# Patient Record
Sex: Male | Born: 1995 | Race: Black or African American | Hispanic: No | Marital: Single | Smoking: Current every day smoker
Health system: Southern US, Community
[De-identification: ages and names within clinical notes are randomized; demographics above are authoritative.]

## PROBLEM LIST (undated history)

## (undated) HISTORY — PX: OTHER SURGICAL HISTORY: SHX169

---

## 2017-01-31 ENCOUNTER — Emergency Department (HOSPITAL_COMMUNITY)
Admission: EM | Admit: 2017-01-31 | Discharge: 2017-02-01 | Disposition: A | Discharge status: Home / Self Care | Payer: 59 | Attending: Emergency Medicine | Admitting: Emergency Medicine

## 2017-01-31 ENCOUNTER — Encounter (HOSPITAL_COMMUNITY): Payer: Self-pay

## 2017-01-31 DIAGNOSIS — F17200 Nicotine dependence, unspecified, uncomplicated: Secondary | ICD-10-CM | POA: Insufficient documentation

## 2017-01-31 DIAGNOSIS — J069 Acute upper respiratory infection, unspecified: Secondary | ICD-10-CM | POA: Diagnosis not present

## 2017-01-31 DIAGNOSIS — J029 Acute pharyngitis, unspecified: Secondary | ICD-10-CM | POA: Diagnosis present

## 2017-01-31 DIAGNOSIS — B9789 Other viral agents as the cause of diseases classified elsewhere: Secondary | ICD-10-CM | POA: Insufficient documentation

## 2017-01-31 LAB — RAPID STREP SCREEN (MED CTR MEBANE ONLY): STREPTOCOCCUS, GROUP A SCREEN (DIRECT): NEGATIVE

## 2017-01-31 NOTE — ED Triage Notes (Signed)
Pt states that since Sat he has had sinus drainage, sore throat and wheezing

## 2017-02-01 MED ORDER — PROMETHAZINE-DM 6.25-15 MG/5ML PO SYRP: 5.0000 mL | ORAL_SOLUTION | Freq: Four times a day (QID) | ORAL | 0 refills | Status: DC | PRN

## 2017-02-01 MED ORDER — LIDOCAINE VISCOUS 2 % MT SOLN: 15.0000 mL | OROMUCOSAL | 0 refills | Status: DC | PRN

## 2017-02-01 MED ORDER — SALINE SPRAY 0.65 % NA SOLN
1.0000 | NASAL | 0 refills | Status: DC | PRN
Start: 2017-02-01 — End: 2020-01-23

## 2017-02-01 MED ORDER — LIDOCAINE VISCOUS 2 % MT SOLN
15.0000 mL | Freq: Once | OROMUCOSAL | Status: AC
  Administered 2017-02-01: 15 mL via OROMUCOSAL
  Filled 2017-02-01: qty 15

## 2017-02-01 NOTE — Discharge Instructions (Signed)
Your symptoms are consistent with a viral upper respiratory infection.  Treatment for this includes treating your symptoms.  You can swallow 15 mL of viscous lidocaine every 3 hours as needed for sore throat.  Please do not use this medication more than once every 3 hours or swallowing more than directed.  You can take 5 mL of the promethazine dextromethorphan cough syrup every 6 hours as needed for cough and nasal congestion.  Saline [place] nasal spray can be sprayed once in each nostril as needed for congestion.  There also over-the-counter treatments that may also help your symptoms.  You can continue to gargle salt water and drink hot tea to help your sore throat.  Some studies have shown improvement in cough when you add honey to tea.   Take 650 mg of Tylenol once every 6 hours or 1000 mg of Tylenol once every 8 hours for body aches and pain.  You may also use 600 mg of ibuprofen, as long as it is taken with food, once every 6 hours to help with body aches and pain.   It may take up to a week before your symptoms start to improve.  Your cough may last up to 4-6 weeks.  If you develop any new or worsening symptoms, including shortness of breath, chest pain, vomiting, or fever that does not improve with Tylenol, please return to the emergency department for reevaluation.  Please continue to wash your hands and cover your mouth when you cough to avoid spread of infection.

## 2017-02-01 NOTE — ED Provider Notes (Signed)
[facility] Provider Note   CSN: [id number] Arrival date & time: 01/31/17  2203     History   Chief Complaint Chief Complaint  Patient presents with  . Sore Throat    HPI Travis Black is a 21 y.o. male who presents to the emergency department with constant nasal congestion, intermittent productive cough with yellow sputum, sore throat, postnasal drip, and rhinorrhea that began 5 days ago.  He denies fever, chills, myalgias, shortness of breath, chest pain, neck pain or stiffness, or headache.  No sick contacts at home.  He reports that he is treated his sore throat by gargling salt water and drinking hot tea with lemon with moderate improvement.  He denies muffled voice, drooling, or trismus.  No sick contacts at home.  He reports that he smokes approximately 2-3 Black and mild cigars every day.  No history of asthma or other chronic pulmonary conditions.  He is not up-to-date on his flu shot.  The history is provided by the patient. No language interpreter was used.    History reviewed. No pertinent past medical history.  There are no active problems to display for this patient.   Past Surgical History:  Procedure Laterality Date  . arm surgery         Home Medications    Prior to Admission medications   Medication Sig Start Date End Date Taking? Authorizing Provider  lidocaine (XYLOCAINE) 2 % solution Use as directed 15 mLs in the mouth or throat every 3 (three) hours as needed for mouth pain. 02/01/17   Alder Murri A, PA-C  promethazine-dextromethorphan (PROMETHAZINE-DM) 6.25-15 MG/5ML syrup Take 5 mLs by mouth 4 (four) times daily as needed for cough. 02/01/17   Turner Kunzman A, PA-C  sodium chloride (OCEAN) 0.65 % SOLN nasal spray Place 1 spray into both nostrils as needed for congestion. 02/01/17   Ariahna Smiddy, Pedro EarlsMia A, PA-C    Family History No family history on file.  Social History Social History   Tobacco Use  .  Smoking status: Current Every Day Smoker  . Smokeless tobacco: Never Used  Substance Use Topics  . Alcohol use: No    Frequency: Never  . Drug use: No     Allergies   Patient has no known allergies.   Review of Systems Review of Systems  Constitutional: Negative for appetite change, chills, fatigue and fever.  HENT: Positive for congestion, postnasal drip, rhinorrhea and sore throat. Negative for drooling, ear discharge, ear pain, mouth sores, sinus pressure, sinus pain and trouble swallowing.   Eyes: Negative for visual disturbance.  Respiratory: Positive for cough. Negative for chest tightness, shortness of breath, wheezing and stridor.   Cardiovascular: Negative for chest pain, palpitations and leg swelling.  Gastrointestinal: Negative for abdominal pain, diarrhea, nausea and vomiting.  Genitourinary: Negative for dysuria, frequency, hematuria and urgency.  Musculoskeletal: Negative for arthralgias, back pain, myalgias and neck stiffness.  Skin: Negative for rash.  Neurological: Negative for syncope, light-headedness, numbness and headaches.  Hematological: Negative for adenopathy.  Psychiatric/Behavioral: The patient is not nervous/anxious.   All other systems reviewed and are negative.    Physical Exam Updated Vital Signs BP 140/90   Pulse 86   Temp 98.3 F (36.8 C) (Oral)   Resp 18   Ht 5\' 9"  (1.753 m)   Wt 97.5 kg (215 lb)   SpO2 99%   BMI 31.75 kg/m   Physical Exam  Constitutional: He appears well-developed.  HENT:  Head: Normocephalic and  atraumatic.  Right Ear: No mastoid tenderness. A middle ear effusion is present.  Left Ear: No mastoid tenderness. A middle ear effusion is present.  Nose: Mucosal edema and rhinorrhea present. Right sinus exhibits no maxillary sinus tenderness and no frontal sinus tenderness. Left sinus exhibits no maxillary sinus tenderness and no frontal sinus tenderness.  Mouth/Throat: Uvula is midline and mucous membranes are normal.  Mucous membranes are not pale. No trismus in the jaw. Posterior oropharyngeal erythema present. No oropharyngeal exudate, posterior oropharyngeal edema or tonsillar abscesses. No tonsillar exudate.  Eyes: Conjunctivae are normal. Right eye exhibits no discharge. Left eye exhibits no discharge. No scleral icterus.  Neck: Neck supple.  Cardiovascular: Normal rate, regular rhythm, normal heart sounds and intact distal pulses. Exam reveals no gallop and no friction rub.  No murmur heard. Pulmonary/Chest: Effort normal and breath sounds normal. No stridor. No respiratory distress. He has no wheezes. He has no rales. He exhibits no tenderness.  Lungs are clear to auscultation bilaterally.  No wheezing.  Abdominal: Soft. He exhibits no distension. There is no tenderness.  Neurological: He is alert.  Skin: Skin is warm and dry.  Psychiatric: His behavior is normal.  Nursing note and vitals reviewed.    ED Treatments / Results  Labs (all labs ordered are listed, but only abnormal results are displayed) Labs Reviewed  RAPID STREP SCREEN (NOT AT [facility])  CULTURE, GROUP A STREP [facility]    EKG  EKG Interpretation None       Radiology No results found.  Procedures Procedures (including critical care time)  Medications Ordered in ED Medications  lidocaine (XYLOCAINE) 2 % viscous mouth solution 15 mL (15 mLs Mouth/Throat Given 02/01/17 0043)     Initial Impression / Assessment and Plan / ED Course  I have reviewed the triage vital signs and the nursing notes.  Pertinent labs & imaging results that were available during my care of the patient were reviewed by me and considered in my medical decision making (see chart for details).     21 year old male presenting with URI symptoms times 5 days.  On physical exam, lungs are clear to auscultation bilaterally.  SaO2 99% on room air.  Chest x-rays not indicated at this time.  Rapid strep test is negative.  Patients symptoms are consistent  with URI, likely viral etiology. Discussed that antibiotics are not indicated for viral infections. Pt will be discharged with symptomatic treatment.  Verbalizes understanding and is agreeable with plan. Pt is hemodynamically stable & in NAD prior to dc.  Strict return precautions given.  Patient is safe for discharge at this time.  Final Clinical Impressions(s) / ED Diagnoses   Final diagnoses:  Viral URI with cough    ED Discharge Orders        Ordered    lidocaine (XYLOCAINE) 2 % solution  Every  3 hours PRN     02/01/17 0033    promethazine-dextromethorphan (PROMETHAZINE-DM) 6.25-15 MG/5ML syrup  4 times daily PRN     02/01/17 0033    sodium chloride (OCEAN) 0.65 % SOLN nasal spray  As needed     02/01/17 0033       Jaxtyn Linville, Pedro EarlsMia A, PA-C 02/01/17 0045    Dione BoozeGlick, David, MD 02/01/17 [phone]

## 2017-02-03 LAB — CULTURE, GROUP A STREP (THRC)

## 2017-03-08 DIAGNOSIS — G479 Sleep disorder, unspecified: Secondary | ICD-10-CM | POA: Diagnosis not present

## 2017-03-08 DIAGNOSIS — R197 Diarrhea, unspecified: Secondary | ICD-10-CM | POA: Diagnosis not present

## 2017-03-08 DIAGNOSIS — Z711 Person with feared health complaint in whom no diagnosis is made: Secondary | ICD-10-CM | POA: Diagnosis not present

## 2017-04-05 DIAGNOSIS — Z0000 Encounter for general adult medical examination without abnormal findings: Secondary | ICD-10-CM | POA: Diagnosis not present

## 2017-04-05 DIAGNOSIS — Z1329 Encounter for screening for other suspected endocrine disorder: Secondary | ICD-10-CM | POA: Diagnosis not present

## 2017-04-05 DIAGNOSIS — Z13220 Encounter for screening for lipoid disorders: Secondary | ICD-10-CM | POA: Diagnosis not present

## 2017-04-05 DIAGNOSIS — Z130 Encounter for screening for diseases of the blood and blood-forming organs and certain disorders involving the immune mechanism: Secondary | ICD-10-CM | POA: Diagnosis not present

## 2017-10-28 DIAGNOSIS — Z23 Encounter for immunization: Secondary | ICD-10-CM | POA: Diagnosis not present

## 2018-05-28 DIAGNOSIS — J45909 Unspecified asthma, uncomplicated: Secondary | ICD-10-CM | POA: Diagnosis not present

## 2018-05-29 DIAGNOSIS — J45901 Unspecified asthma with (acute) exacerbation: Secondary | ICD-10-CM | POA: Diagnosis not present

## 2018-05-29 DIAGNOSIS — Z720 Tobacco use: Secondary | ICD-10-CM | POA: Diagnosis not present

## 2018-06-03 DIAGNOSIS — J45901 Unspecified asthma with (acute) exacerbation: Secondary | ICD-10-CM | POA: Diagnosis not present

## 2019-11-02 ENCOUNTER — Emergency Department (HOSPITAL_BASED_OUTPATIENT_CLINIC_OR_DEPARTMENT_OTHER)
Admission: EM | Admit: 2019-11-02 | Discharge: 2019-11-02 | Disposition: A | Discharge status: Home / Self Care | Payer: 59 | Attending: Emergency Medicine | Admitting: Emergency Medicine

## 2019-11-02 ENCOUNTER — Other Ambulatory Visit: Payer: Self-pay

## 2019-11-02 ENCOUNTER — Emergency Department (HOSPITAL_BASED_OUTPATIENT_CLINIC_OR_DEPARTMENT_OTHER): Payer: 59

## 2019-11-02 ENCOUNTER — Encounter (HOSPITAL_BASED_OUTPATIENT_CLINIC_OR_DEPARTMENT_OTHER): Payer: Self-pay | Admitting: Emergency Medicine

## 2019-11-02 DIAGNOSIS — K625 Hemorrhage of anus and rectum: Secondary | ICD-10-CM | POA: Diagnosis not present

## 2019-11-02 DIAGNOSIS — R109 Unspecified abdominal pain: Secondary | ICD-10-CM | POA: Diagnosis present

## 2019-11-02 DIAGNOSIS — K648 Other hemorrhoids: Secondary | ICD-10-CM | POA: Insufficient documentation

## 2019-11-02 DIAGNOSIS — K644 Residual hemorrhoidal skin tags: Secondary | ICD-10-CM

## 2019-11-02 DIAGNOSIS — F17210 Nicotine dependence, cigarettes, uncomplicated: Secondary | ICD-10-CM | POA: Insufficient documentation

## 2019-11-02 LAB — COMPREHENSIVE METABOLIC PANEL
ALT: 19 U/L (ref 0–44)
AST: 25 U/L (ref 15–41)
Albumin: 4.5 g/dL (ref 3.5–5.0)
Alkaline Phosphatase: 40 U/L (ref 38–126)
Anion gap: 10 (ref 5–15)
BUN: 15 mg/dL (ref 6–20)
CO2: 24 mmol/L (ref 22–32)
Calcium: 9.4 mg/dL (ref 8.9–10.3)
Chloride: 102 mmol/L (ref 98–111)
Creatinine, Ser: 0.85 mg/dL (ref 0.61–1.24)
GFR calc Af Amer: >60 mL/min (ref >60)
GFR calc non Af Amer: >60 mL/min (ref >60)
Glucose, Bld: 95 mg/dL (ref 70–99)
Potassium: 4.2 mmol/L (ref 3.5–5.1)
Sodium: 136 mmol/L (ref 135–145)
Total Bilirubin: 0.4 mg/dL (ref 0.3–1.2)
Total Protein: 8.1 g/dL (ref 6.5–8.1)

## 2019-11-02 LAB — CBC
HCT: 46.5 % (ref 39.0–52.0)
Hemoglobin: 15.5 g/dL (ref 13.0–17.0)
MCH: 30.8 pg (ref 26.0–34.0)
MCHC: 33.3 g/dL (ref 30.0–36.0)
MCV: 92.4 fL (ref 80.0–100.0)
Platelets: 285 10*3/uL (ref 150–400)
RBC: 5.03 MIL/uL (ref 4.22–5.81)
RDW: 12.5 % (ref 11.5–15.5)
WBC: 8.6 10*3/uL (ref 4.0–10.5)
nRBC: 0 % (ref 0.0–0.2)

## 2019-11-02 MED ORDER — HYDROCORTISONE ACETATE 25 MG RE SUPP: 25.0000 mg | Freq: Two times a day (BID) | RECTAL | 0 refills | Status: DC

## 2019-11-02 MED ORDER — IOHEXOL 300 MG/ML  SOLN
100.0000 mL | Freq: Once | INTRAMUSCULAR | Status: AC | PRN
  Administered 2019-11-02: 100 mL via INTRAVENOUS

## 2019-11-02 NOTE — Discharge Instructions (Signed)
Your blood count is normal right now and your CT scan showed no inflamed bowel.  Your bleeding is likely from hemorrhoids.  You can try the suppositories to help you with your hemorrhage.  Since you have persistent bleeding, please follow-up with GI doctor for further evaluation.  Return to ER if you have uncontrolled bleeding and severe abdominal pain and vomiting or fevers.

## 2019-11-02 NOTE — ED Triage Notes (Signed)
Reports lower abdominal pain for the last week with blood in stool.  Reports poor appetite.  Denies nausea.  Reports hx of hemorhoids that he treats with otc meds.  Reports blood is sometimes bright red and sometimes dark in color.

## 2019-11-02 NOTE — ED Provider Notes (Signed)
[facility] EMERGENCY DEPARTMENT Provider Note   CSN: [id number] Arrival date & time: 11/02/19  1521     History Chief Complaint  Patient presents with  . Abdominal Pain    Travis Black is a 24 y.o. male here presenting with blood in the stool and abdominal pain.  Patient states that for the last several months he has intermittent abdominal pain and also blood in his stool.  He states that sometimes it is dark and other times it is bright red.  He thought he has hemorrhoids and tried some Anusol cream with minimal relief.  He states that he has poor appetite and the cramps got worse so came in for further evaluation.  Denies any fevers.  The history is provided by the patient.       History reviewed. No pertinent past medical history.  There are no problems to display for this patient.   Past Surgical History:  Procedure Laterality Date  . arm surgery         No family history on file.  Social History   Tobacco Use  . Smoking status: Current Every Day Smoker  . Smokeless tobacco: Never Used  Substance Use Topics  . Alcohol use: No  . Drug use: No    Home Medications Prior to Admission medications   Medication Sig Start Date End Date Taking? Authorizing Provider  lidocaine (XYLOCAINE) 2 % solution Use as directed 15 mLs in the mouth or throat every 3 (three) hours as needed for mouth pain. 02/01/17   McDonald, Mia A, PA-C  promethazine-dextromethorphan (PROMETHAZINE-DM) 6.25-15 MG/5ML syrup Take 5 mLs by mouth 4 (four) times daily as needed for cough. 02/01/17   McDonald, Mia A, PA-C  sodium chloride (OCEAN) 0.65 % SOLN nasal spray Place 1 spray into both nostrils as needed for congestion. 02/01/17   McDonald, Mia A, PA-C    Allergies    Patient has no known allergies.  Review of Systems   Review of Systems  Gastrointestinal: Positive for abdominal pain.  All other systems reviewed and are negative.   Physical Exam Updated Vital Signs BP  131/79 (BP Location: Right Arm)   Pulse 60   Temp 98 F (36.7 C) (Oral)   Resp 16   Ht 5\' 10"  (1.778 m)   Wt 111.1 kg   SpO2 100%   BMI 35.15 kg/m   Physical Exam Vitals and nursing note reviewed.  HENT:     Head: Normocephalic.     Mouth/Throat:     Mouth: Mucous membranes are moist.  Eyes:     Extraocular Movements: Extraocular movements intact.  Cardiovascular:     Rate and Rhythm: Normal rate and regular rhythm.  Abdominal:     General: Abdomen is flat.     Comments: Mild diffuse lower abdominal tenderness with no rebound  Genitourinary:    Comments: Rectal-small external hemorrhoid with no obvious bleeding Skin:    General: Skin is warm.     Capillary Refill: Capillary refill takes less than 2 seconds.  Neurological:     General: No focal deficit present.     Mental Status: He is alert and oriented to person, place, and time.  Psychiatric:        Mood and Affect: Mood normal.        Behavior: Behavior normal.     ED Results / Procedures / Treatments   Labs (all labs ordered are listed, but only abnormal results are displayed) Labs Reviewed  COMPREHENSIVE METABOLIC  PANEL  CBC  OCCULT BLOOD X 1 CARD TO LAB, STOOL    EKG None  Radiology CT ABDOMEN PELVIS W CONTRAST  Result Date: 11/02/2019 CLINICAL DATA:  Rectal bleeding low abdominal pain EXAM: CT ABDOMEN AND PELVIS WITH CONTRAST TECHNIQUE: Multidetector CT imaging of the abdomen and pelvis was performed using the standard protocol following bolus administration of intravenous contrast. CONTRAST:  OMNIPAQUE IOHEXOL 300 MG/ML  SOLN COMPARISON:  None. FINDINGS: Lower chest: No acute abnormality. Hepatobiliary: No focal liver abnormality is seen. No gallstones, gallbladder wall thickening, or biliary dilatation. Pancreas: Unremarkable. No pancreatic ductal dilatation or surrounding inflammatory changes. Spleen: Normal in size without focal abnormality. Adrenals/Urinary Tract: Adrenal glands are unremarkable.  Kidneys are normal, without renal calculi, focal lesion, or hydronephrosis. Bladder is unremarkable. Stomach/Bowel: Stomach is within normal limits. Appendix appears normal. No evidence of bowel wall thickening, distention, or inflammatory changes. Vascular/Lymphatic: No significant vascular findings are present. No enlarged abdominal or pelvic lymph nodes. Reproductive: Prostate is unremarkable. Other: No abdominal wall hernia or abnormality. No abdominopelvic ascites. Musculoskeletal: No acute or significant osseous findings. IMPRESSION: Negative. No CT evidence for acute intra-abdominal or pelvic abnormality. Electronically Signed   By: Jasmine Pang M.D.   On: 11/02/2019 20:19    Procedures Procedures (including critical care time)  Medications Ordered in ED Medications  iohexol (OMNIPAQUE) 300 MG/ML solution 100 mL (100 mLs Intravenous Contrast Given 11/02/19 2001)    ED Course  I have reviewed the triage vital signs and the nursing notes.  Pertinent labs & imaging results that were available during my care of the patient were reviewed by me and considered in my medical decision making (see chart for details).    MDM Rules/Calculators/A&P                         Travis Black is a 24 y.o. male here presenting with intermittent blood in the stool for the last several months and abdominal pain.  Consider IBD versus IBS versus hemorrhoid bleeding.  Plan to check CBC and CMP and CT abdomen pelvis.  8:37 PM CBC is stable and CT did not show any obvious colitis.  Patient likely has hemorrhoidal bleeding.  We will try Anusol suppositories and have him follow-up with GI.   Final Clinical Impression(s) / ED Diagnoses Final diagnoses:  None    Rx / DC Orders ED Discharge Orders    None       Charlynne Pander, MD 11/02/19 2038

## 2020-01-23 ENCOUNTER — Encounter (HOSPITAL_BASED_OUTPATIENT_CLINIC_OR_DEPARTMENT_OTHER): Payer: Self-pay | Admitting: Emergency Medicine

## 2020-01-23 ENCOUNTER — Ambulatory Visit (HOSPITAL_COMMUNITY): Payer: 59 | Admitting: Anesthesiology

## 2020-01-23 ENCOUNTER — Ambulatory Visit (HOSPITAL_COMMUNITY)
Admission: RE | Admit: 2020-01-23 | Discharge: 2020-01-23 | Disposition: A | Discharge status: Home / Self Care | Payer: 59 | Source: Other Acute Inpatient Hospital | Attending: Orthopedic Surgery | Admitting: Orthopedic Surgery

## 2020-01-23 ENCOUNTER — Other Ambulatory Visit: Payer: Self-pay | Admitting: Orthopedic Surgery

## 2020-01-23 ENCOUNTER — Encounter (HOSPITAL_COMMUNITY): Payer: Self-pay | Admitting: Orthopedic Surgery

## 2020-01-23 ENCOUNTER — Emergency Department (HOSPITAL_BASED_OUTPATIENT_CLINIC_OR_DEPARTMENT_OTHER): Payer: 59

## 2020-01-23 ENCOUNTER — Ambulatory Visit (HOSPITAL_COMMUNITY): Payer: 59

## 2020-01-23 ENCOUNTER — Emergency Department (HOSPITAL_BASED_OUTPATIENT_CLINIC_OR_DEPARTMENT_OTHER)
Admission: EM | Admit: 2020-01-23 | Discharge: 2020-01-23 | Disposition: A | Discharge status: Home / Self Care | Payer: 59 | Source: Home / Self Care | Attending: Emergency Medicine | Admitting: Emergency Medicine

## 2020-01-23 ENCOUNTER — Encounter (HOSPITAL_COMMUNITY): Admission: RE | Disposition: A | Discharge status: Home / Self Care | Payer: Self-pay | Attending: Orthopedic Surgery

## 2020-01-23 ENCOUNTER — Other Ambulatory Visit: Payer: Self-pay

## 2020-01-23 DIAGNOSIS — S52571A Other intraarticular fracture of lower end of right radius, initial encounter for closed fracture: Secondary | ICD-10-CM

## 2020-01-23 DIAGNOSIS — S63391A Traumatic rupture of other ligament of right wrist, initial encounter: Secondary | ICD-10-CM | POA: Insufficient documentation

## 2020-01-23 DIAGNOSIS — Z20822 Contact with and (suspected) exposure to covid-19: Secondary | ICD-10-CM | POA: Insufficient documentation

## 2020-01-23 DIAGNOSIS — S63094A Other dislocation of right wrist and hand, initial encounter: Secondary | ICD-10-CM | POA: Insufficient documentation

## 2020-01-23 DIAGNOSIS — S62171G Displaced fracture of trapezium [larger multangular], right wrist, subsequent encounter for fracture with delayed healing: Secondary | ICD-10-CM | POA: Insufficient documentation

## 2020-01-23 DIAGNOSIS — Z79899 Other long term (current) drug therapy: Secondary | ICD-10-CM | POA: Insufficient documentation

## 2020-01-23 DIAGNOSIS — G5601 Carpal tunnel syndrome, right upper limb: Secondary | ICD-10-CM | POA: Diagnosis not present

## 2020-01-23 DIAGNOSIS — F17290 Nicotine dependence, other tobacco product, uncomplicated: Secondary | ICD-10-CM | POA: Insufficient documentation

## 2020-01-23 DIAGNOSIS — S62001A Unspecified fracture of navicular [scaphoid] bone of right wrist, initial encounter for closed fracture: Secondary | ICD-10-CM

## 2020-01-23 DIAGNOSIS — S52614A Nondisplaced fracture of right ulna styloid process, initial encounter for closed fracture: Secondary | ICD-10-CM

## 2020-01-23 DIAGNOSIS — S52301A Unspecified fracture of shaft of right radius, initial encounter for closed fracture: Secondary | ICD-10-CM | POA: Diagnosis present

## 2020-01-23 DIAGNOSIS — Y92410 Unspecified street and highway as the place of occurrence of the external cause: Secondary | ICD-10-CM | POA: Insufficient documentation

## 2020-01-23 DIAGNOSIS — S52511A Displaced fracture of right radial styloid process, initial encounter for closed fracture: Secondary | ICD-10-CM | POA: Insufficient documentation

## 2020-01-23 HISTORY — PX: OPEN REDUCTION INTERNAL FIXATION (ORIF) SCAPHOID WITH DISTAL RADIUS GRAFT: SHX5667

## 2020-01-23 HISTORY — PX: PERCUTANEOUS PINNING: SHX2209

## 2020-01-23 LAB — RESP PANEL BY RT-PCR (FLU A&B, COVID) ARPGX2
Influenza A by PCR: NEGATIVE
Influenza B by PCR: NEGATIVE
SARS Coronavirus 2 by RT PCR: NEGATIVE

## 2020-01-23 SURGERY — OPEN REDUCTION INTERNAL FIXATION (ORIF) SCAPHOID WITH DISTAL RADIUS GRAFT
Anesthesia: General | Laterality: Right

## 2020-01-23 MED ORDER — FENTANYL CITRATE (PF) 250 MCG/5ML IJ SOLN
INTRAMUSCULAR | Status: AC
  Filled 2020-01-23: qty 5

## 2020-01-23 MED ORDER — PROPOFOL 10 MG/ML IV BOLUS
INTRAVENOUS | Status: DC | PRN
  Administered 2020-01-23: 200 mg via INTRAVENOUS

## 2020-01-23 MED ORDER — OXYCODONE-ACETAMINOPHEN 5-325 MG PO TABS: 1.0000 | ORAL_TABLET | ORAL | 0 refills | Status: DC | PRN

## 2020-01-23 MED ORDER — KETOROLAC TROMETHAMINE 30 MG/ML IJ SOLN
30.0000 mg | Freq: Once | INTRAMUSCULAR | Status: AC
  Administered 2020-01-23: 30 mg via INTRAVENOUS
  Filled 2020-01-23: qty 1

## 2020-01-23 MED ORDER — CHLORHEXIDINE GLUCONATE 0.12 % MT SOLN
OROMUCOSAL | Status: AC
  Administered 2020-01-23: 15 mL via OROMUCOSAL
  Filled 2020-01-23: qty 15

## 2020-01-23 MED ORDER — CHLORHEXIDINE GLUCONATE 0.12 % MT SOLN: 15.0000 mL | Freq: Once | OROMUCOSAL | Status: AC

## 2020-01-23 MED ORDER — ORAL CARE MOUTH RINSE: 15.0000 mL | Freq: Once | OROMUCOSAL | Status: AC

## 2020-01-23 MED ORDER — DOXYCYCLINE HYCLATE 50 MG PO CAPS: 100.0000 mg | ORAL_CAPSULE | Freq: Two times a day (BID) | ORAL | 0 refills | Status: AC

## 2020-01-23 MED ORDER — CEFAZOLIN SODIUM-DEXTROSE 2-3 GM-%(50ML) IV SOLR
INTRAVENOUS | Status: DC | PRN
  Administered 2020-01-23: 2 g via INTRAVENOUS

## 2020-01-23 MED ORDER — FENTANYL CITRATE (PF) 100 MCG/2ML IJ SOLN: 100.0000 ug | Freq: Once | INTRAMUSCULAR | Status: AC

## 2020-01-23 MED ORDER — HYDROCODONE-ACETAMINOPHEN 5-325 MG PO TABS: ORAL_TABLET | ORAL | 0 refills | Status: AC

## 2020-01-23 MED ORDER — SODIUM CHLORIDE 0.9 % IR SOLN
Status: DC | PRN
  Administered 2020-01-23: 1000 mL

## 2020-01-23 MED ORDER — HYDROCODONE-ACETAMINOPHEN 5-325 MG PO TABS
2.0000 | ORAL_TABLET | Freq: Once | ORAL | Status: AC
  Administered 2020-01-23: 2 via ORAL
  Filled 2020-01-23: qty 2

## 2020-01-23 MED ORDER — MIDAZOLAM HCL 2 MG/2ML IJ SOLN
INTRAMUSCULAR | Status: AC
  Administered 2020-01-23: 2 mg via INTRAVENOUS
  Filled 2020-01-23: qty 2

## 2020-01-23 MED ORDER — MIDAZOLAM HCL 2 MG/2ML IJ SOLN: 0.5000 mg | Freq: Once | INTRAMUSCULAR | Status: DC | PRN

## 2020-01-23 MED ORDER — MIDAZOLAM HCL 2 MG/2ML IJ SOLN
INTRAMUSCULAR | Status: AC
  Filled 2020-01-23: qty 2

## 2020-01-23 MED ORDER — OXYCODONE HCL 5 MG PO TABS: 5.0000 mg | ORAL_TABLET | Freq: Once | ORAL | Status: DC | PRN

## 2020-01-23 MED ORDER — MIDAZOLAM HCL 2 MG/2ML IJ SOLN: 2.0000 mg | Freq: Once | INTRAMUSCULAR | Status: AC

## 2020-01-23 MED ORDER — CEFAZOLIN SODIUM-DEXTROSE 2-4 GM/100ML-% IV SOLN
INTRAVENOUS | Status: AC
  Filled 2020-01-23: qty 100

## 2020-01-23 MED ORDER — HYDROMORPHONE HCL 1 MG/ML IJ SOLN: 0.2500 mg | INTRAMUSCULAR | Status: DC | PRN

## 2020-01-23 MED ORDER — FENTANYL CITRATE (PF) 100 MCG/2ML IJ SOLN
INTRAMUSCULAR | Status: AC
  Administered 2020-01-23: 100 ug via INTRAVENOUS
  Filled 2020-01-23: qty 2

## 2020-01-23 MED ORDER — BUPIVACAINE-EPINEPHRINE (PF) 0.5% -1:200000 IJ SOLN
INTRAMUSCULAR | Status: DC | PRN
  Administered 2020-01-23: 30 mL via PERINEURAL

## 2020-01-23 MED ORDER — MEPERIDINE HCL 25 MG/ML IJ SOLN: 6.2500 mg | INTRAMUSCULAR | Status: DC | PRN

## 2020-01-23 MED ORDER — LACTATED RINGERS IV SOLN: INTRAVENOUS | Status: DC

## 2020-01-23 MED ORDER — OXYCODONE HCL 5 MG/5ML PO SOLN: 5.0000 mg | Freq: Once | ORAL | Status: DC | PRN

## 2020-01-23 MED ORDER — PROMETHAZINE HCL 25 MG/ML IJ SOLN: 6.2500 mg | INTRAMUSCULAR | Status: DC | PRN

## 2020-01-23 MED ORDER — FENTANYL CITRATE (PF) 100 MCG/2ML IJ SOLN
INTRAMUSCULAR | Status: DC | PRN
  Administered 2020-01-23 (×2): 50 ug via INTRAVENOUS

## 2020-01-23 SURGICAL SUPPLY — 49 items
0.045 X 4" K-WIRE used as implant with Juggerknot ×9 IMPLANT
ANCHOR SUT 1.45 SZ 1 SHORT (Anchor) ×3 IMPLANT
BNDG ELASTIC 3X5.8 VLCR STR LF (GAUZE/BANDAGES/DRESSINGS) ×3 IMPLANT
BNDG ELASTIC 4X5.8 VLCR STR LF (GAUZE/BANDAGES/DRESSINGS) ×3 IMPLANT
BNDG ESMARK 4X9 LF (GAUZE/BANDAGES/DRESSINGS) ×3 IMPLANT
BNDG GAUZE ELAST 4 BULKY (GAUZE/BANDAGES/DRESSINGS) ×3 IMPLANT
CHLORAPREP W/TINT 26 (MISCELLANEOUS) ×3 IMPLANT
CORD BIPOLAR FORCEPS 12FT (ELECTRODE) ×3 IMPLANT
COVER SURGICAL LIGHT HANDLE (MISCELLANEOUS) ×3 IMPLANT
CUFF TOURN SGL QUICK 18X4 (TOURNIQUET CUFF) ×3 IMPLANT
DRAPE OEC MINIVIEW 54X84 (DRAPES) ×3 IMPLANT
DRAPE SURG 17X23 STRL (DRAPES) ×3 IMPLANT
DRSG PAD ABDOMINAL 8X10 ST (GAUZE/BANDAGES/DRESSINGS) ×6 IMPLANT
GAUZE SPONGE 4X4 12PLY STRL (GAUZE/BANDAGES/DRESSINGS) ×6 IMPLANT
GAUZE XEROFORM 5X9 LF (GAUZE/BANDAGES/DRESSINGS) ×3 IMPLANT
GLOVE BIO SURGEON STRL SZ7.5 (GLOVE) ×3 IMPLANT
GLOVE BIOGEL PI IND STRL 8 (GLOVE) ×1 IMPLANT
GLOVE BIOGEL PI INDICATOR 8 (GLOVE) ×2
GOWN STRL REUS W/ TWL LRG LVL3 (GOWN DISPOSABLE) ×3 IMPLANT
GOWN STRL REUS W/TWL LRG LVL3 (GOWN DISPOSABLE) ×6
K-WIRE .045X4 (WIRE) ×9 IMPLANT
KIT BASIN OR (CUSTOM PROCEDURE TRAY) ×3 IMPLANT
KIT TURNOVER KIT B (KITS) ×3 IMPLANT
LOOP VESSEL MINI RED (MISCELLANEOUS) ×3 IMPLANT
MANIFOLD NEPTUNE II (INSTRUMENTS) ×3 IMPLANT
NS IRRIG 1000ML POUR BTL (IV SOLUTION) ×3 IMPLANT
PACK ORTHO EXTREMITY (CUSTOM PROCEDURE TRAY) ×3 IMPLANT
PAD ARMBOARD 7.5X6 YLW CONV (MISCELLANEOUS) ×3 IMPLANT
PAD CAST 3X4 CTTN HI CHSV (CAST SUPPLIES) ×2 IMPLANT
PADDING CAST COTTON 3X4 STRL (CAST SUPPLIES) ×4
SLEEVE SCD COMPRESS THIGH MED (MISCELLANEOUS) ×3 IMPLANT
SPLINT PLASTER EXTRA FAST 3X15 (CAST SUPPLIES) ×2
SPLINT PLASTER GYPS XFAST 3X15 (CAST SUPPLIES) ×1 IMPLANT
SPONGE LAP 18X18 RF (DISPOSABLE) ×3 IMPLANT
SPONGE LAP 4X18 RFD (DISPOSABLE) ×3 IMPLANT
SUCTION FRAZIER HANDLE 10FR (MISCELLANEOUS) ×2
SUCTION TUBE FRAZIER 10FR DISP (MISCELLANEOUS) ×1 IMPLANT
SUT ETHILON 3 0 PS 1 (SUTURE) ×3 IMPLANT
SUT ETHILON 4 0 P 3 18 (SUTURE) ×3 IMPLANT
SUT ETHILON 4 0 PS 2 18 (SUTURE) ×9 IMPLANT
SUT VIC AB 2-0 SH 27 (SUTURE) ×2
SUT VIC AB 2-0 SH 27X BRD (SUTURE) ×1 IMPLANT
SUT VIC AB 3-0 PS2 18 (SUTURE) ×6 IMPLANT
TOWEL GREEN STERILE (TOWEL DISPOSABLE) ×3 IMPLANT
TOWEL GREEN STERILE FF (TOWEL DISPOSABLE) ×3 IMPLANT
TUBE CONNECTING 12'X1/4 (SUCTIONS) ×1
TUBE CONNECTING 12X1/4 (SUCTIONS) ×2 IMPLANT
UNDERPAD 30X36 HEAVY ABSORB (UNDERPADS AND DIAPERS) ×3 IMPLANT
YANKAUER SUCT BULB TIP NO VENT (SUCTIONS) ×3 IMPLANT

## 2020-01-23 NOTE — Anesthesia Procedure Notes (Signed)
Procedure Name: LMA Insertion Date/Time: 01/23/2020 6:47 PM Performed by: Gwenyth Allegra, CRNA Pre-anesthesia Checklist: Patient identified, Emergency Drugs available, Suction available, Patient being monitored and Timeout performed Patient Re-evaluated:Patient Re-evaluated prior to induction Oxygen Delivery Method: Circle system utilized Preoxygenation: Pre-oxygenation with 100% oxygen Induction Type: IV induction LMA: LMA inserted LMA Size: 4.0 Number of attempts: 1 Placement Confirmation: positive ETCO2 and breath sounds checked- equal and bilateral Tube secured with: Tape Dental Injury: Teeth and Oropharynx as per pre-operative assessment
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
## 2020-01-23 NOTE — Anesthesia Preprocedure Evaluation (Addendum)
Anesthesia Evaluation  Patient identified by MRN, date of birth, ID band Patient awake    Reviewed: Allergy & Precautions, NPO status , Patient's Chart, lab work & pertinent test results  History of Anesthesia Complications Negative for: history of anesthetic complications  Airway Mallampati: II  TM Distance: >3 FB Neck ROM: Full    Dental  (+) Dental Advisory Given   Pulmonary Current SmokerPatient did not abstain from smoking.,    breath sounds clear to auscultation       Cardiovascular negative cardio ROS   Rhythm:Regular Rate:Normal     Neuro/Psych negative neurological ROS     GI/Hepatic negative GI ROS, Neg liver ROS,   Endo/Other  Morbid obesity  Renal/GU negative Renal ROS     Musculoskeletal   Abdominal (+) + obese,   Peds  Hematology negative hematology ROS (+)   Anesthesia Other Findings   Reproductive/Obstetrics                            Anesthesia Physical Anesthesia Plan  ASA: II  Anesthesia Plan: General   Post-op Pain Management: GA combined w/ Regional for post-op pain   Induction: Intravenous  PONV Risk Score and Plan: 1 and Ondansetron and Dexamethasone  Airway Management Planned: LMA  Additional Equipment: None  Intra-op Plan:   Post-operative Plan:   Informed Consent: I have reviewed the patients History and Physical, chart, labs and discussed the procedure including the risks, benefits and alternatives for the proposed anesthesia with the patient or authorized representative who has indicated his/her understanding and acceptance.     Dental advisory given  Plan Discussed with: CRNA and Surgeon  Anesthesia Plan Comments:        Anesthesia Quick Evaluation
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
## 2020-01-23 NOTE — Progress Notes (Addendum)
Pacu Discharge Note  Patient instuctions were given to family. Wound care, diet, pain, follow up care and how and whom to contact with concerns were discussed. Family aware someone needs to remain with patient overnight and concerns after receiving anesthesia and what to avoid and safety. Answered all questions and concerns. Pt has R arm in sling. Aware to keep arm elevated above heart.   Discharge paperwork has clear contact informations for surgeon and 24 hour RN line for concerns.   Discussed what concerns to look for including infection and signs/symptoms to look for.   Phone/charger, clothes returned to patient. Blue bag has wallet.   IV was removed prior to discharge. Patient was brought to car with belongings.   Pt exits my care.

## 2020-01-23 NOTE — Anesthesia Postprocedure Evaluation (Signed)
Anesthesia Post Note  Patient: Travis Black  Procedure(s) Performed: OPEN REDUCTION INTERNAL FIXATION (ORIF)  ORIF PINNING LUNATE (Right ) ORIF PINNING LUNATE (Right )     Patient location during evaluation: PACU Anesthesia Type: General Level of consciousness: awake Pain management: pain level controlled Vital Signs Assessment: post-procedure vital signs reviewed and stable Respiratory status: spontaneous breathing Cardiovascular status: stable Postop Assessment: no apparent nausea or vomiting Anesthetic complications: no   No complications documented.  Last Vitals:  Vitals:   01/23/20 2115 01/23/20 2130  BP: (!) 141/92 (!) 142/93  Pulse: 74 70  Resp: 16 16  Temp:    SpO2: 96% 97%    Last Pain:  Vitals:   01/23/20 2130  TempSrc:   PainSc: 0-No pain                 Neelam Tiggs
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
## 2020-01-23 NOTE — Anesthesia Procedure Notes (Signed)
Anesthesia Regional Block: Supraclavicular block   Pre-Anesthetic Checklist: ,, timeout performed, Correct Patient, Correct Site, Correct Laterality, Correct Procedure, Correct Position, site marked, Risks and benefits discussed,  Surgical consent,  Pre-op evaluation,  At surgeon's request and post-op pain management  Laterality: Right and Upper  Prep: chloraprep       Needles:  Injection technique: Single-shot  Needle Type: Echogenic Needle     Needle Length: 9cm  Needle Gauge: 21     Additional Needles:   Procedures:,,,, ultrasound used (permanent image in chart),,,,  Narrative:  Start time: 01/23/2020 4:29 PM End time: 01/23/2020 4:38 PM Injection made incrementally with aspirations every 5 mL.  Performed by: Personally  Anesthesiologist: Jairo Ben, MD  Additional Notes: Pt identified in Holding room.  Monitors applied. Working IV access confirmed. Sterile prep R clavicle and neck.  #21ga ECHOgenic Arrow block needle to supraclavicular brachial plexus with US guidance.  30cc 0.5% Bupivacaine with 1:200k epi injected incrementally after negative test dose.  Patient asymptomatic, VSS, no heme aspirated, tolerated well.  Sandford Craze, MD

## 2020-01-23 NOTE — Discharge Instructions (Signed)

## 2020-01-23 NOTE — Op Note (Signed)
I assisted Surgeon(s) and Role:    * Betha Loa, MD - Primary    Cindee Salt, MD - Assisting on the Procedure(s): OPEN REDUCTION INTERNAL FIXATION (ORIF)  ORIF PINNING LUNATE ORIF PINNING LUNATE on 01/23/2020.  I provided assistance on this case as follows: set up, approach, decompression median nerve, identification of the lunate dislocation, reduction of the lunate, repair of the lunotriquetral ligament,from palmar approach, making the dorsal incision with PIN resection and repair of the scapholunate ligament with an anchor, pinning of the scapholunate, lunotriquetral and scaphocapitate joints, closure of the incisions both dorsal and volar  And application of the dressings and splints. Electronically signed by: Cindee Salt, MD Date: 01/23/2020 Time: 8:49 PM
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
## 2020-01-23 NOTE — Transfer of Care (Signed)
Immediate Anesthesia Transfer of Care Note  Patient: Travis Black  Procedure(s) Performed: OPEN REDUCTION INTERNAL FIXATION (ORIF)  ORIF PINNING LUNATE (Right ) ORIF PINNING LUNATE (Right )  Patient Location: PACU  Anesthesia Type:GA combined with regional for post-op pain  Level of Consciousness: drowsy and patient cooperative  Airway & Oxygen Therapy: Patient Spontanous Breathing and Patient connected to nasal cannula oxygen  Post-op Assessment: Report given to RN and Post -op Vital signs reviewed and stable  Post vital signs: Reviewed and stable  Last Vitals:  Vitals Value Taken Time  BP    Temp    Pulse 89 01/23/20 2100  Resp 20 01/23/20 2100  SpO2 100 % 01/23/20 2100  Vitals shown include unvalidated device data.  Last Pain:  Vitals:   01/23/20 1358  TempSrc:   PainSc: 8       Patients Stated Pain Goal: 3 (01/23/20 1358)  Complications: No complications documented.

## 2020-01-27 ENCOUNTER — Encounter (HOSPITAL_COMMUNITY): Payer: Self-pay | Admitting: Orthopedic Surgery
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
